# Patient Record
Sex: Male | Born: 2000 | Race: Black or African American | Hispanic: No | Marital: Single
Health system: Southern US, Community
[De-identification: ages and names within clinical notes are randomized; demographics above are authoritative.]

---
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
## 2021-05-15 ENCOUNTER — Ambulatory Visit: Payer: BC Managed Care – PPO

## 2021-05-15 ENCOUNTER — Ambulatory Visit (LOCAL_COMMUNITY_HEALTH_CENTER): Payer: BC Managed Care – PPO

## 2021-05-15 ENCOUNTER — Other Ambulatory Visit: Payer: Self-pay

## 2021-05-15 DIAGNOSIS — Z23 Encounter for immunization: Secondary | ICD-10-CM

## 2021-05-15 DIAGNOSIS — Z719 Counseling, unspecified: Secondary | ICD-10-CM

## 2021-05-15 NOTE — Progress Notes (Signed)
Patient in nurse clinic for Tdap immunization for school. Vaccine administered and patient tolerated well. [facility] updated and copy given to patient. Ann Held, RN
